# Patient Record
Sex: Male | Born: 1980 | Race: White | Hispanic: No | Marital: Single
Health system: Southern US, Community
[De-identification: ages and names within clinical notes are randomized; demographics above are authoritative.]

## PROBLEM LIST (undated history)

## (undated) DIAGNOSIS — I10 Essential (primary) hypertension: Secondary | ICD-10-CM

---

## 2017-11-02 ENCOUNTER — Emergency Department (HOSPITAL_COMMUNITY): Payer: Non-veteran care

## 2017-11-02 ENCOUNTER — Inpatient Hospital Stay (HOSPITAL_COMMUNITY)
Admission: EM | Admit: 2017-11-02 | Discharge: 2017-11-06 | DRG: 897 | Disposition: A | Discharge status: Home / Self Care | Payer: Non-veteran care | Attending: Internal Medicine | Admitting: Internal Medicine

## 2017-11-02 ENCOUNTER — Encounter (HOSPITAL_COMMUNITY): Payer: Self-pay | Admitting: Internal Medicine

## 2017-11-02 DIAGNOSIS — E875 Hyperkalemia: Secondary | ICD-10-CM | POA: Diagnosis present

## 2017-11-02 DIAGNOSIS — R748 Abnormal levels of other serum enzymes: Secondary | ICD-10-CM | POA: Diagnosis present

## 2017-11-02 DIAGNOSIS — R7989 Other specified abnormal findings of blood chemistry: Secondary | ICD-10-CM

## 2017-11-02 DIAGNOSIS — Y9355 Activity, bike riding: Secondary | ICD-10-CM | POA: Diagnosis not present

## 2017-11-02 DIAGNOSIS — K219 Gastro-esophageal reflux disease without esophagitis: Secondary | ICD-10-CM | POA: Diagnosis present

## 2017-11-02 DIAGNOSIS — D696 Thrombocytopenia, unspecified: Secondary | ICD-10-CM | POA: Diagnosis present

## 2017-11-02 DIAGNOSIS — S01512D Laceration without foreign body of oral cavity, subsequent encounter: Secondary | ICD-10-CM | POA: Diagnosis not present

## 2017-11-02 DIAGNOSIS — I10 Essential (primary) hypertension: Secondary | ICD-10-CM | POA: Diagnosis present

## 2017-11-02 DIAGNOSIS — R569 Unspecified convulsions: Secondary | ICD-10-CM | POA: Diagnosis present

## 2017-11-02 DIAGNOSIS — S01512A Laceration without foreign body of oral cavity, initial encounter: Secondary | ICD-10-CM | POA: Diagnosis present

## 2017-11-02 DIAGNOSIS — F10239 Alcohol dependence with withdrawal, unspecified: Secondary | ICD-10-CM | POA: Diagnosis present

## 2017-11-02 DIAGNOSIS — Z79899 Other long term (current) drug therapy: Secondary | ICD-10-CM | POA: Diagnosis not present

## 2017-11-02 DIAGNOSIS — X58XXXD Exposure to other specified factors, subsequent encounter: Secondary | ICD-10-CM | POA: Diagnosis not present

## 2017-11-02 DIAGNOSIS — F10939 Alcohol use, unspecified with withdrawal, unspecified: Secondary | ICD-10-CM | POA: Diagnosis present

## 2017-11-02 DIAGNOSIS — R945 Abnormal results of liver function studies: Secondary | ICD-10-CM

## 2017-11-02 DIAGNOSIS — K760 Fatty (change of) liver, not elsewhere classified: Secondary | ICD-10-CM | POA: Diagnosis not present

## 2017-11-02 DIAGNOSIS — F1010 Alcohol abuse, uncomplicated: Secondary | ICD-10-CM

## 2017-11-02 DIAGNOSIS — R740 Nonspecific elevation of levels of transaminase and lactic acid dehydrogenase [LDH]: Secondary | ICD-10-CM | POA: Diagnosis present

## 2017-11-02 HISTORY — DX: Essential (primary) hypertension: I10

## 2017-11-02 LAB — COMPREHENSIVE METABOLIC PANEL
ALBUMIN: 3.8 g/dL (ref 3.5–5.0)
ALT: 80 U/L — AB (ref 0–44)
AST: 126 U/L — AB (ref 15–41)
Alkaline Phosphatase: 49 U/L (ref 38–126)
Anion gap: 8 (ref 5–15)
BUN: 8 mg/dL (ref 6–20)
CHLORIDE: 106 mmol/L (ref 98–111)
CO2: 22 mmol/L (ref 22–32)
Calcium: 8.2 mg/dL — ABNORMAL LOW (ref 8.9–10.3)
Creatinine, Ser: 0.94 mg/dL (ref 0.61–1.24)
GFR calc Af Amer: >60 mL/min (ref >60)
GFR calc non Af Amer: >60 mL/min (ref >60)
GLUCOSE: 120 mg/dL — AB (ref 70–99)
POTASSIUM: 5.2 mmol/L — AB (ref 3.5–5.1)
SODIUM: 136 mmol/L (ref 135–145)
Total Bilirubin: 2.5 mg/dL — ABNORMAL HIGH (ref 0.3–1.2)
Total Protein: 5.8 g/dL — ABNORMAL LOW (ref 6.5–8.1)

## 2017-11-02 LAB — APTT: aPTT: 29 seconds (ref 24–36)

## 2017-11-02 LAB — RENAL FUNCTION PANEL
ALBUMIN: 4.3 g/dL (ref 3.5–5.0)
ANION GAP: 13 (ref 5–15)
BUN: 7 mg/dL (ref 6–20)
CHLORIDE: 103 mmol/L (ref 98–111)
CO2: 22 mmol/L (ref 22–32)
Calcium: 9.6 mg/dL (ref 8.9–10.3)
Creatinine, Ser: 0.88 mg/dL (ref 0.61–1.24)
GFR calc Af Amer: >60 mL/min (ref >60)
Glucose, Bld: 92 mg/dL (ref 70–99)
PHOSPHORUS: 2.7 mg/dL (ref 2.5–4.6)
POTASSIUM: 3.9 mmol/L (ref 3.5–5.1)
Sodium: 138 mmol/L (ref 135–145)

## 2017-11-02 LAB — ETHANOL: Alcohol, Ethyl (B): <10 mg/dL (ref <10)

## 2017-11-02 LAB — CBC
HEMATOCRIT: 40.1 % (ref 39.0–52.0)
HEMOGLOBIN: 13.3 g/dL (ref 13.0–17.0)
MCH: 33 pg (ref 26.0–34.0)
MCHC: 33.2 g/dL (ref 30.0–36.0)
MCV: 99.5 fL (ref 80.0–100.0)
Platelets: 65 10*3/uL — ABNORMAL LOW (ref 150–400)
RBC: 4.03 MIL/uL — ABNORMAL LOW (ref 4.22–5.81)
RDW: 12.2 % (ref 11.5–15.5)
WBC: 5.6 10*3/uL (ref 4.0–10.5)
nRBC: 0 % (ref 0.0–0.2)

## 2017-11-02 LAB — PROTIME-INR
INR: 1.04
Prothrombin Time: 13.5 seconds (ref 11.4–15.2)

## 2017-11-02 LAB — RAPID URINE DRUG SCREEN, HOSP PERFORMED
AMPHETAMINES: NOT DETECTED
BARBITURATES: NOT DETECTED
Benzodiazepines: NOT DETECTED
Cocaine: NOT DETECTED
OPIATES: NOT DETECTED
TETRAHYDROCANNABINOL: NOT DETECTED

## 2017-11-02 LAB — MAGNESIUM: Magnesium: 1.8 mg/dL (ref 1.7–2.4)

## 2017-11-02 MED ORDER — LORAZEPAM 2 MG/ML IJ SOLN
0.0000 mg | Freq: Four times a day (QID) | INTRAMUSCULAR | Status: DC
  Administered 2017-11-02: 1 mg via INTRAVENOUS
  Filled 2017-11-02: qty 1

## 2017-11-02 MED ORDER — LIDOCAINE-EPINEPHRINE (PF) 2 %-1:200000 IJ SOLN
20.0000 mL | Freq: Once | INTRAMUSCULAR | Status: AC
  Administered 2017-11-02: 20 mL
  Filled 2017-11-02: qty 20

## 2017-11-02 MED ORDER — LACTATED RINGERS IV SOLN
INTRAVENOUS | Status: AC
  Administered 2017-11-02 – 2017-11-03 (×2): via INTRAVENOUS
  Administered 2017-11-03: 100 mL via INTRAVENOUS

## 2017-11-02 MED ORDER — LORAZEPAM 2 MG/ML IJ SOLN
1.0000 mg | Freq: Once | INTRAMUSCULAR | Status: AC
  Administered 2017-11-02: 1 mg via INTRAVENOUS
  Filled 2017-11-02: qty 1

## 2017-11-02 MED ORDER — LORAZEPAM 2 MG/ML IJ SOLN
2.0000 mg | INTRAMUSCULAR | Status: DC | PRN
  Administered 2017-11-02 – 2017-11-03 (×3): 3 mg via INTRAVENOUS
  Administered 2017-11-03 (×3): 2 mg via INTRAVENOUS
  Administered 2017-11-03: 3 mg via INTRAVENOUS
  Administered 2017-11-03 (×4): 2 mg via INTRAVENOUS
  Administered 2017-11-03 (×3): 3 mg via INTRAVENOUS
  Administered 2017-11-03 – 2017-11-06 (×8): 2 mg via INTRAVENOUS
  Filled 2017-11-02 (×2): qty 1
  Filled 2017-11-02: qty 2
  Filled 2017-11-02 (×8): qty 1
  Filled 2017-11-02 (×3): qty 2
  Filled 2017-11-02 (×4): qty 1
  Filled 2017-11-02 (×2): qty 2
  Filled 2017-11-02 (×3): qty 1

## 2017-11-02 MED ORDER — SODIUM CHLORIDE 0.9 % IV BOLUS
1000.0000 mL | Freq: Once | INTRAVENOUS | Status: AC
  Administered 2017-11-02: 1000 mL via INTRAVENOUS

## 2017-11-02 MED ORDER — VITAMIN B-1 100 MG PO TABS
100.0000 mg | ORAL_TABLET | Freq: Every day | ORAL | Status: DC
  Administered 2017-11-05 – 2017-11-06 (×2): 100 mg via ORAL
  Filled 2017-11-02 (×2): qty 1

## 2017-11-02 MED ORDER — LORAZEPAM 1 MG PO TABS: 0.0000 mg | ORAL_TABLET | Freq: Four times a day (QID) | ORAL | Status: DC

## 2017-11-02 MED ORDER — THIAMINE HCL 100 MG/ML IJ SOLN: 100.0000 mg | Freq: Every day | INTRAMUSCULAR | Status: DC

## 2017-11-02 MED ORDER — CHLORDIAZEPOXIDE HCL 25 MG PO CAPS: 50.0000 mg | ORAL_CAPSULE | Freq: Three times a day (TID) | ORAL | Status: DC

## 2017-11-02 MED ORDER — ADULT MULTIVITAMIN W/MINERALS CH
1.0000 | ORAL_TABLET | Freq: Every day | ORAL | Status: DC
  Administered 2017-11-04 – 2017-11-06 (×3): 1 via ORAL
  Filled 2017-11-02 (×4): qty 1

## 2017-11-02 MED ORDER — ACETAMINOPHEN 650 MG RE SUPP: 650.0000 mg | Freq: Four times a day (QID) | RECTAL | Status: DC | PRN

## 2017-11-02 MED ORDER — FOLIC ACID 5 MG/ML IJ SOLN
1.0000 mg | Freq: Every day | INTRAMUSCULAR | Status: DC
  Administered 2017-11-03 – 2017-11-05 (×3): 1 mg via INTRAVENOUS
  Filled 2017-11-02 (×4): qty 0.2

## 2017-11-02 MED ORDER — LORAZEPAM 2 MG/ML IJ SOLN: 0.0000 mg | Freq: Two times a day (BID) | INTRAMUSCULAR | Status: DC

## 2017-11-02 MED ORDER — ACETAMINOPHEN 325 MG PO TABS
650.0000 mg | ORAL_TABLET | Freq: Four times a day (QID) | ORAL | Status: DC | PRN
  Administered 2017-11-04: 650 mg via ORAL
  Filled 2017-11-02: qty 2

## 2017-11-02 MED ORDER — THIAMINE HCL 100 MG/ML IJ SOLN
100.0000 mg | Freq: Every day | INTRAMUSCULAR | Status: DC
  Administered 2017-11-02 – 2017-11-04 (×3): 100 mg via INTRAVENOUS
  Filled 2017-11-02 (×3): qty 2

## 2017-11-02 MED ORDER — LORAZEPAM 1 MG PO TABS: 0.0000 mg | ORAL_TABLET | Freq: Two times a day (BID) | ORAL | Status: DC

## 2017-11-02 MED ORDER — LABETALOL HCL 5 MG/ML IV SOLN
10.0000 mg | INTRAVENOUS | Status: DC | PRN
  Administered 2017-11-02: 10 mg via INTRAVENOUS
  Filled 2017-11-02: qty 4

## 2017-11-02 MED ORDER — TETANUS-DIPHTH-ACELL PERTUSSIS 5-2.5-18.5 LF-MCG/0.5 IM SUSP
0.5000 mL | Freq: Once | INTRAMUSCULAR | Status: AC
  Administered 2017-11-02: 0.5 mL via INTRAMUSCULAR
  Filled 2017-11-02: qty 0.5

## 2017-11-02 MED ORDER — SENNOSIDES-DOCUSATE SODIUM 8.6-50 MG PO TABS: 1.0000 | ORAL_TABLET | Freq: Every evening | ORAL | Status: DC | PRN
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
## 2017-11-02 NOTE — ED Notes (Signed)
Z61 $1's counted with Autumn RN, given back to pt, in a purple crown royal bag.  2 handles of liquor taken by security and dumped out.

## 2017-11-02 NOTE — ED Notes (Signed)
Patient transported to CT 
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
## 2017-11-02 NOTE — ED Notes (Signed)
Per Dr. Denton Lank order, patient given water to rinse out mouth and suction.

## 2017-11-02 NOTE — ED Notes (Signed)
ED Provider at bedside. 

## 2017-11-02 NOTE — ED Notes (Signed)
Rounding team at the bedside.

## 2017-11-02 NOTE — ED Notes (Signed)
Patient returned from CT
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
## 2017-11-02 NOTE — ED Notes (Signed)
Pt continually attempting to get out of the bed and taking off monitoring equipment. RN and tech attempting to redirect patient back to bed. Pt is very high fall risk and very unsteady on his feet.

## 2017-11-03 ENCOUNTER — Inpatient Hospital Stay (HOSPITAL_COMMUNITY): Payer: Non-veteran care

## 2017-11-03 LAB — CBC
HEMATOCRIT: 39 % (ref 39.0–52.0)
HEMOGLOBIN: 13.3 g/dL (ref 13.0–17.0)
MCH: 34 pg (ref 26.0–34.0)
MCHC: 34.1 g/dL (ref 30.0–36.0)
MCV: 99.7 fL (ref 80.0–100.0)
PLATELETS: 61 10*3/uL — AB (ref 150–400)
RBC: 3.91 MIL/uL — ABNORMAL LOW (ref 4.22–5.81)
RDW: 12.4 % (ref 11.5–15.5)
WBC: 5.3 10*3/uL (ref 4.0–10.5)
nRBC: 0 % (ref 0.0–0.2)

## 2017-11-03 LAB — COMPREHENSIVE METABOLIC PANEL
ALBUMIN: 4.1 g/dL (ref 3.5–5.0)
ALK PHOS: 51 U/L (ref 38–126)
ALT: 68 U/L — ABNORMAL HIGH (ref 0–44)
AST: 77 U/L — ABNORMAL HIGH (ref 15–41)
Anion gap: 11 (ref 5–15)
BUN: 6 mg/dL (ref 6–20)
CALCIUM: 9.2 mg/dL (ref 8.9–10.3)
CHLORIDE: 101 mmol/L (ref 98–111)
CO2: 23 mmol/L (ref 22–32)
Creatinine, Ser: 0.85 mg/dL (ref 0.61–1.24)
GFR calc Af Amer: >60 mL/min (ref >60)
GFR calc non Af Amer: >60 mL/min (ref >60)
GLUCOSE: 89 mg/dL (ref 70–99)
Potassium: 3.6 mmol/L (ref 3.5–5.1)
SODIUM: 135 mmol/L (ref 135–145)
Total Bilirubin: 2.1 mg/dL — ABNORMAL HIGH (ref 0.3–1.2)
Total Protein: 6.5 g/dL (ref 6.5–8.1)

## 2017-11-03 LAB — HIV ANTIBODY (ROUTINE TESTING W REFLEX): HIV SCREEN 4TH GENERATION: NONREACTIVE

## 2017-11-03 LAB — MRSA PCR SCREENING: MRSA by PCR: NEGATIVE

## 2017-11-03 MED ORDER — PHENYLEPHRINE HCL-NACL 10-0.9 MG/250ML-% IV SOLN: 0.0000 ug/min | INTRAVENOUS | Status: DC

## 2017-11-03 MED ORDER — LACTATED RINGERS IV SOLN
INTRAVENOUS | Status: DC
  Administered 2017-11-03 – 2017-11-04 (×2): via INTRAVENOUS

## 2017-11-03 NOTE — Progress Notes (Signed)
Pt currently withdrawing from alcohol. CIWA 20 @ 2300 and 17 @ midnight. Pt's BP in 160's/100's. Paged on call resident, and received orders to continue treating with ativan and no new orders for treating high blood pressure.

## 2017-11-03 NOTE — Progress Notes (Addendum)
Paged by RN about increasing CIWA score with agitation, tremors, and hypertension. Examined and evaluated patient at bedside with senior resident. He was observed in bed with mittens and restraints in place. He is intermittently somnolent and falling asleep while examination. Able to follow directions. However unable to stay awake long enough to answer orientation questions. Nurse tech mentions that she was pinched once during his bouts of agitation. RN informed us that he will stay somnolent for about 30 minutes or so and become agitated for 30 minutes before the next Ativan dose. Per chart review, patient's last dose of Ativan was 30 minutes ago. Blood pressure is in 163/110, which is consistent with his bp since admission. Telemetry was reviewed which showed sinus tachycardia with HR fluctuating from 100s to 130s.  Based on the fact that he is somnolent and not currently agitated on examination, current dose of Ativan appears to be appropriate for the patient. We will continue to observe for now.

## 2017-11-03 NOTE — Care Management Note (Addendum)
Case Management Note  Patient Details  Name: Gregory Newman MRN: [id number] Date of Birth: 19-Apr-1980  Subjective/Objective:  Pt presented for ETOH withdrawal/ Seizures. PTA from home with support of mother. Mother in the room at the time of conversation.                   Action/Plan: Patient utilizes the [facility]- CM did call Fees Coordinator April Alexander to question in regards to PCP and CSW. Patient unable to make CM aware of PCP. Awaiting call back. Per mother patient receives medications via mail from [facility] [place]. Mother not sure how compliant the patient is with medications at this time.    Expected Discharge Date:                  Expected Discharge Plan:  Home/Self Care  In-House Referral:  Clinical Social Work  Discharge planning Services  CM Consult  Post Acute Care Choice:   N/A Choice offered to:   N/A  DME Arranged:   N/A DME Agency:   N/A  HH Arranged:   N/A HH Agency:   N/A  Status of Service:Completed   If discussed at Long Length of Stay Meetings, dates discussed:    Additional Comments: 1443 11-06-17 Tomi Bamberger, RN,BSN [phone] Patient was provided resources to [facility] from CSW- CM will fax d/c summary to [place]. No further needs from CM at this time.    1047 11-03-17 Tomi Bamberger, RN,BSN [phone] CM received phone call from April Alexander in regards to patients PCP. PCP is Dr. Melvyn Novas. Salvadore Dom is the Clinical Social Worker @ [phone] ext [phone] Pager # [phone]. Please fax d/c summary and any new medications to [phone] once stable. CM will continue to monitor for additional needs.  Gala Lewandowsky, RN 11/03/2017, 10:25 AM

## 2017-11-03 NOTE — Progress Notes (Signed)
Dr. Karilyn Cota notified of elevated diastolic pressures.  No new orders at this time.
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
## 2017-11-04 DIAGNOSIS — I10 Essential (primary) hypertension: Secondary | ICD-10-CM

## 2017-11-04 DIAGNOSIS — S01512D Laceration without foreign body of oral cavity, subsequent encounter: Secondary | ICD-10-CM

## 2017-11-04 DIAGNOSIS — F10239 Alcohol dependence with withdrawal, unspecified: Principal | ICD-10-CM

## 2017-11-04 DIAGNOSIS — X58XXXD Exposure to other specified factors, subsequent encounter: Secondary | ICD-10-CM

## 2017-11-04 DIAGNOSIS — Z79899 Other long term (current) drug therapy: Secondary | ICD-10-CM

## 2017-11-04 DIAGNOSIS — R569 Unspecified convulsions: Secondary | ICD-10-CM

## 2017-11-04 DIAGNOSIS — R740 Nonspecific elevation of levels of transaminase and lactic acid dehydrogenase [LDH]: Secondary | ICD-10-CM

## 2017-11-04 LAB — HEPATITIS A ANTIBODY, IGM: Hep A IgM: NEGATIVE

## 2017-11-04 LAB — HEPATITIS A ANTIBODY, TOTAL: HEP A TOTAL AB: POSITIVE — AB

## 2017-11-04 LAB — HEPATITIS B SURFACE ANTIBODY, QUANTITATIVE: Hepatitis B-Post: >1000 m[IU]/mL (ref >9.9)

## 2017-11-04 LAB — HCV COMMENT:

## 2017-11-04 LAB — HEPATITIS B CORE ANTIBODY, IGM: HEP B C IGM: NEGATIVE

## 2017-11-04 LAB — HEPATITIS C ANTIBODY (REFLEX)

## 2017-11-04 LAB — HEPATITIS B SURFACE ANTIGEN: HEP B S AG: NEGATIVE

## 2017-11-04 MED ORDER — PNEUMOCOCCAL VAC POLYVALENT 25 MCG/0.5ML IJ INJ
0.5000 mL | INJECTION | INTRAMUSCULAR | Status: DC
  Filled 2017-11-04: qty 0.5

## 2017-11-04 MED ORDER — INFLUENZA VAC SPLIT QUAD 0.5 ML IM SUSY
0.5000 mL | PREFILLED_SYRINGE | INTRAMUSCULAR | Status: DC
  Filled 2017-11-04: qty 0.5

## 2017-11-04 MED ORDER — NICOTINE 14 MG/24HR TD PT24
14.0000 mg | MEDICATED_PATCH | Freq: Every day | TRANSDERMAL | Status: DC
  Administered 2017-11-04 – 2017-11-06 (×3): 14 mg via TRANSDERMAL
  Filled 2017-11-04 (×3): qty 1

## 2017-11-04 NOTE — Plan of Care (Signed)
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
## 2017-11-05 DIAGNOSIS — K760 Fatty (change of) liver, not elsewhere classified: Secondary | ICD-10-CM

## 2017-11-05 MED ORDER — ALUM & MAG HYDROXIDE-SIMETH 200-200-20 MG/5ML PO SUSP
15.0000 mL | Freq: Four times a day (QID) | ORAL | Status: DC | PRN
  Administered 2017-11-05: 15 mL via ORAL
  Filled 2017-11-05: qty 30

## 2017-11-05 MED ORDER — PANTOPRAZOLE SODIUM 40 MG PO TBEC
40.0000 mg | DELAYED_RELEASE_TABLET | Freq: Every day | ORAL | Status: DC
  Administered 2017-11-05 – 2017-11-06 (×2): 40 mg via ORAL
  Filled 2017-11-05 (×2): qty 1

## 2017-11-05 MED ORDER — FOLIC ACID 1 MG PO TABS
1.0000 mg | ORAL_TABLET | Freq: Every day | ORAL | Status: DC
  Administered 2017-11-06: 1 mg via ORAL
  Filled 2017-11-05: qty 1
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
## 2017-11-05 NOTE — Plan of Care (Signed)

## 2017-11-05 NOTE — Progress Notes (Signed)
Telesitter discontinued per MD Rehman. Patient stable.

## 2017-11-06 DIAGNOSIS — R748 Abnormal levels of other serum enzymes: Secondary | ICD-10-CM

## 2017-11-06 DIAGNOSIS — D696 Thrombocytopenia, unspecified: Secondary | ICD-10-CM

## 2017-11-06 MED ORDER — NALTREXONE HCL 50 MG PO TABS: 50.0000 mg | ORAL_TABLET | Freq: Every day | ORAL | 0 refills | Status: AC

## 2017-11-06 MED ORDER — MAGIC MOUTHWASH W/LIDOCAINE: 5.0000 mL | Freq: Three times a day (TID) | ORAL | 0 refills | Status: AC | PRN
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
## 2017-11-06 NOTE — Progress Notes (Signed)
Discharge instructions reviewed with pt. Pt has no questions at this time. Prescription given to pt. Pt's mothers stated she picked up his belongings yesterday. IV d/c. Pt says he has a PCPat the [place].

## 2017-11-06 NOTE — Progress Notes (Signed)
Prior to being discharged pt wanted MD to look at his tongue. Md paged and made aware. New prescription given to pt.
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
## 2019-11-26 ENCOUNTER — Other Ambulatory Visit: Payer: Self-pay

## 2019-11-26 ENCOUNTER — Emergency Department (HOSPITAL_COMMUNITY)
Admission: EM | Admit: 2019-11-26 | Discharge: 2019-11-26 | Disposition: A | Discharge status: Home / Self Care | Payer: No Typology Code available for payment source | Attending: Emergency Medicine | Admitting: Emergency Medicine

## 2019-11-26 ENCOUNTER — Emergency Department (HOSPITAL_COMMUNITY): Payer: No Typology Code available for payment source

## 2019-11-26 DIAGNOSIS — I10 Essential (primary) hypertension: Secondary | ICD-10-CM | POA: Insufficient documentation

## 2019-11-26 DIAGNOSIS — R000 Tachycardia, unspecified: Secondary | ICD-10-CM | POA: Insufficient documentation

## 2019-11-26 DIAGNOSIS — F1020 Alcohol dependence, uncomplicated: Secondary | ICD-10-CM | POA: Insufficient documentation

## 2019-11-26 DIAGNOSIS — R569 Unspecified convulsions: Secondary | ICD-10-CM | POA: Insufficient documentation

## 2019-11-26 LAB — COMPREHENSIVE METABOLIC PANEL
ALT: 45 U/L — ABNORMAL HIGH (ref 0–44)
AST: 95 U/L — ABNORMAL HIGH (ref 15–41)
Albumin: 4 g/dL (ref 3.5–5.0)
Alkaline Phosphatase: 70 U/L (ref 38–126)
Anion gap: 19 — ABNORMAL HIGH (ref 5–15)
BUN: <5 mg/dL — ABNORMAL LOW (ref 6–20)
CO2: 21 mmol/L — ABNORMAL LOW (ref 22–32)
Calcium: 9.9 mg/dL (ref 8.9–10.3)
Chloride: 99 mmol/L (ref 98–111)
Creatinine, Ser: 0.87 mg/dL (ref 0.61–1.24)
GFR, Estimated: >60 mL/min (ref >60)
Glucose, Bld: 136 mg/dL — ABNORMAL HIGH (ref 70–99)
Potassium: 3.7 mmol/L (ref 3.5–5.1)
Sodium: 139 mmol/L (ref 135–145)
Total Bilirubin: 1.2 mg/dL (ref 0.3–1.2)
Total Protein: 6.8 g/dL (ref 6.5–8.1)

## 2019-11-26 LAB — CBC WITH DIFFERENTIAL/PLATELET
Abs Immature Granulocytes: 0.02 10*3/uL (ref 0.00–0.07)
Basophils Absolute: 0.1 10*3/uL (ref 0.0–0.1)
Basophils Relative: 2 %
Eosinophils Absolute: 0 10*3/uL (ref 0.0–0.5)
Eosinophils Relative: 1 %
HCT: 39.3 % (ref 39.0–52.0)
Hemoglobin: 13.1 g/dL (ref 13.0–17.0)
Immature Granulocytes: 1 %
Lymphocytes Relative: 10 %
Lymphs Abs: 0.3 10*3/uL — ABNORMAL LOW (ref 0.7–4.0)
MCH: 33.3 pg (ref 26.0–34.0)
MCHC: 33.3 g/dL (ref 30.0–36.0)
MCV: 100 fL (ref 80.0–100.0)
Monocytes Absolute: 0.5 10*3/uL (ref 0.1–1.0)
Monocytes Relative: 17 %
Neutro Abs: 2 10*3/uL (ref 1.7–7.7)
Neutrophils Relative %: 69 %
Platelets: 67 10*3/uL — ABNORMAL LOW (ref 150–400)
RBC: 3.93 MIL/uL — ABNORMAL LOW (ref 4.22–5.81)
RDW: 14.5 % (ref 11.5–15.5)
WBC: 2.8 10*3/uL — ABNORMAL LOW (ref 4.0–10.5)
nRBC: 0 % (ref 0.0–0.2)

## 2019-11-26 LAB — CBG MONITORING, ED: Glucose-Capillary: 120 mg/dL — ABNORMAL HIGH (ref 70–99)

## 2019-11-26 LAB — LIPASE, BLOOD: Lipase: 82 U/L — ABNORMAL HIGH (ref 11–51)

## 2019-11-26 LAB — SALICYLATE LEVEL: Salicylate Lvl: <7 mg/dL — ABNORMAL LOW (ref 7.0–30.0)

## 2019-11-26 LAB — ACETAMINOPHEN LEVEL: Acetaminophen (Tylenol), Serum: <10 ug/mL — ABNORMAL LOW (ref 10–30)

## 2019-11-26 LAB — ETHANOL: Alcohol, Ethyl (B): <10 mg/dL (ref <10)

## 2019-11-26 MED ORDER — THIAMINE HCL 100 MG/ML IJ SOLN
100.0000 mg | Freq: Every day | INTRAMUSCULAR | Status: DC
  Administered 2019-11-26: 100 mg via INTRAVENOUS
  Filled 2019-11-26: qty 2

## 2019-11-26 MED ORDER — LORAZEPAM 1 MG PO TABS: 0.0000 mg | ORAL_TABLET | Freq: Two times a day (BID) | ORAL | Status: DC

## 2019-11-26 MED ORDER — LORAZEPAM 2 MG/ML IJ SOLN: 0.0000 mg | Freq: Four times a day (QID) | INTRAMUSCULAR | Status: DC

## 2019-11-26 MED ORDER — LORAZEPAM 1 MG PO TABS: 0.0000 mg | ORAL_TABLET | Freq: Four times a day (QID) | ORAL | Status: DC

## 2019-11-26 MED ORDER — CHLORDIAZEPOXIDE HCL 25 MG PO CAPS: ORAL_CAPSULE | ORAL | 0 refills | Status: DC

## 2019-11-26 MED ORDER — LORAZEPAM 2 MG/ML IJ SOLN: 0.0000 mg | Freq: Two times a day (BID) | INTRAMUSCULAR | Status: DC

## 2019-11-26 MED ORDER — THIAMINE HCL 100 MG PO TABS: 100.0000 mg | ORAL_TABLET | Freq: Every day | ORAL | Status: DC

## 2019-11-26 MED ORDER — LORAZEPAM 2 MG/ML IJ SOLN
INTRAMUSCULAR | Status: AC
  Administered 2019-11-26: 2 mg
  Filled 2019-11-26: qty 1

## 2019-11-26 MED ORDER — CHLORDIAZEPOXIDE HCL 25 MG PO CAPS: ORAL_CAPSULE | ORAL | 0 refills | Status: AC

## 2019-11-26 MED ORDER — LEVETIRACETAM IN NACL 1000 MG/100ML IV SOLN
1000.0000 mg | Freq: Once | INTRAVENOUS | Status: AC
  Administered 2019-11-26: 1000 mg via INTRAVENOUS
  Filled 2019-11-26: qty 100

## 2019-11-26 MED ORDER — THIAMINE HCL 100 MG/ML IJ SOLN
Freq: Once | INTRAVENOUS | Status: AC
  Filled 2019-11-26: qty 1000

## 2019-11-26 NOTE — ED Notes (Signed)
Patient transported to CT 

## 2019-11-26 NOTE — ED Notes (Deleted)
Pt verbalized understanding of instructions, physi aware he can return. Did not want discharge paperwork

## 2019-11-26 NOTE — Discharge Instructions (Addendum)
You were seen in the ED for seizures  Your seizures are from alcohol use and alcohol withdrawal  We recommended admission to the hospital for more monitoring and medical detoxing but you declined  You requested Librium.  Please do not take this medicine if you start drinking again.  Please see outpatient resources that you can reach out to for any alcohol use disorder treatment.  Return for headaches, vision changes, bright red blood or black vomit or stool, chest pain, shortness of breath, suicidal or homicidal thoughts, auditory or visual or tactile hallucinations, seizures  [employer] [address] [place], [place] [address] [phone]  [facility]  [address] [place] [address] [phone]   [facility]   [address] [place], [place] [address]  [phone]  [facility]  [address] [place], [place] [address]  [phone]

## 2019-11-26 NOTE — ED Notes (Signed)
Walked patient up the hall patient did ok patient back in the bed call bell in reach
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
## 2019-11-26 NOTE — ED Notes (Signed)
Got patient on the monitor did ekg shown to Dr Anitra Lauth patient is in a gown with call bell in reach
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
## 2019-12-02 LAB — VITAMIN B1: Vitamin B1 (Thiamine): 109 nmol/L (ref 66.5–200.0)

## 2020-08-20 DEATH — deceased

## 2021-04-09 IMAGING — CT CT HEAD W/O CM
4 series · 16 of 47 positions shown, 18 images · non-contrast
Comparison: CT head 11/02/2017

CLINICAL DATA: Seizures.  Alcohol use.

EXAM:
CT HEAD WITHOUT CONTRAST
TECHNIQUE: Contiguous axial images were obtained from the base of the skull
through the vertex without intravenous contrast.

[Series 3: head wo · axial · 0.53mm/px · z∈[-126,-6]mm · 7 of 34 slices shown, 9 images]
[im 5/34  brain]
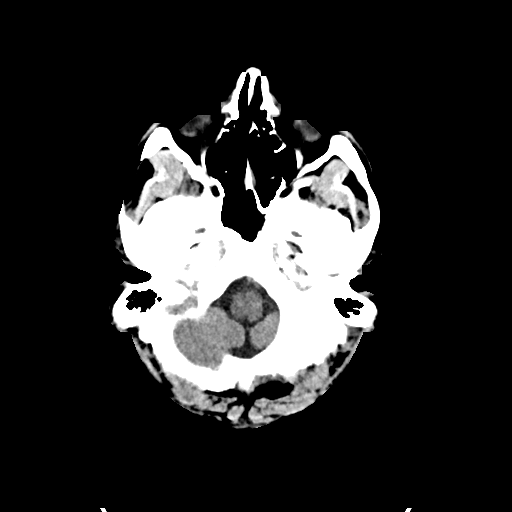
[im 5/34  bone]
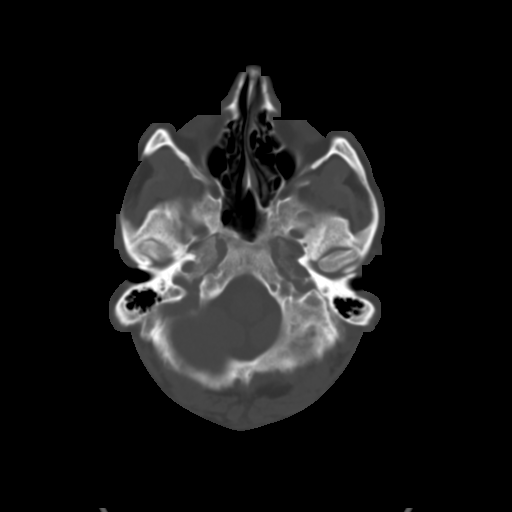
[im 9/34  brain]
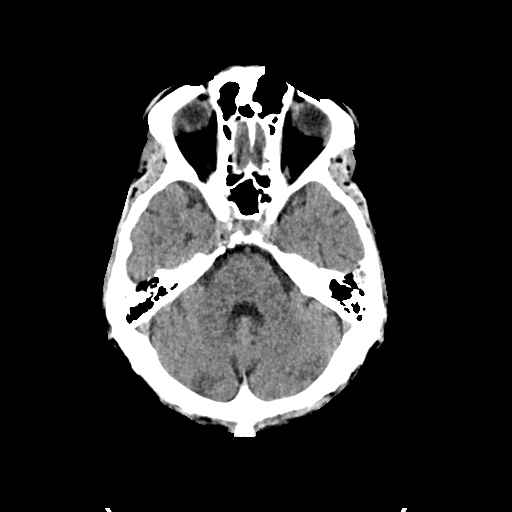
[im 13/34  brain]
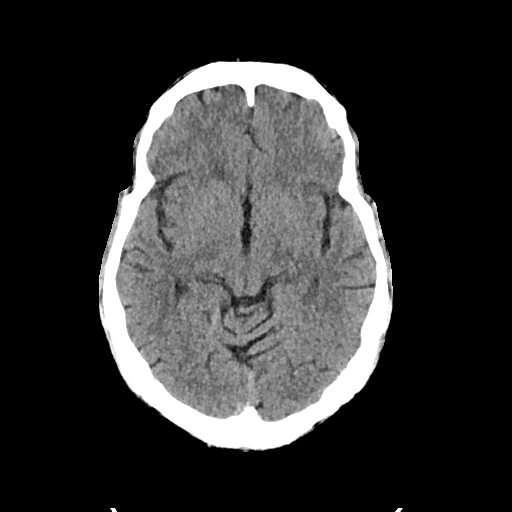
[im 17/34  brain]
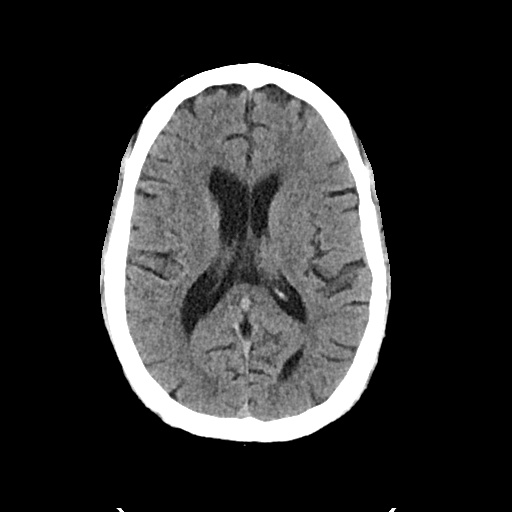
[im 21/34  brain]
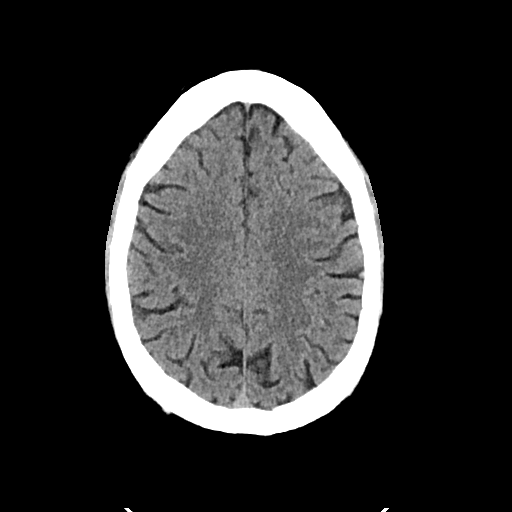
[im 21/34  bone]
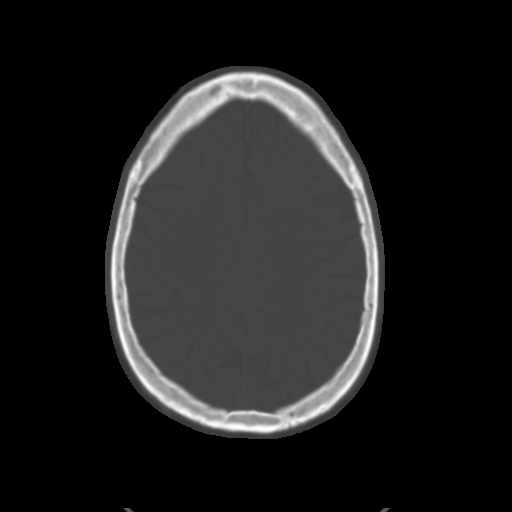
[im 25/34  brain]
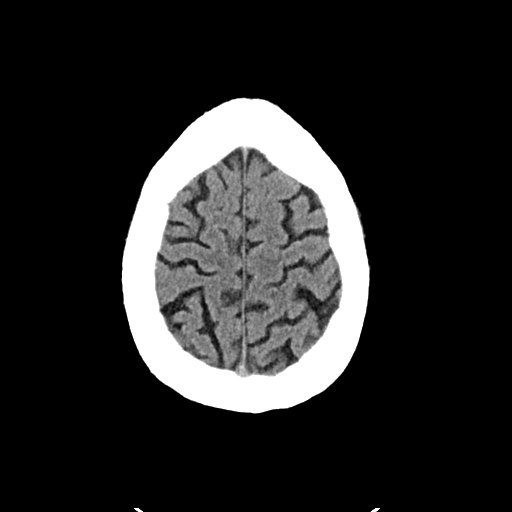
[im 29/34  brain]
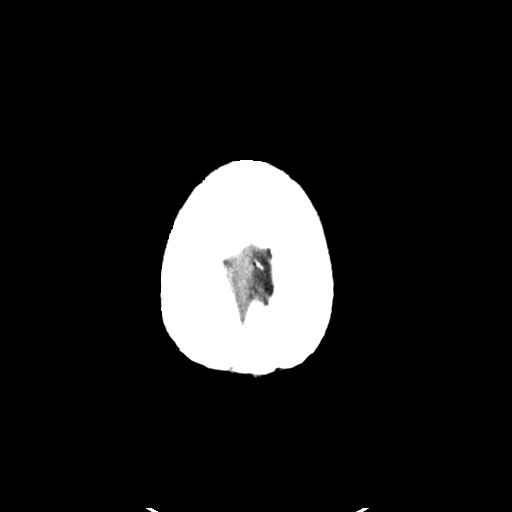

[Series 4: head bone · axial · 0.53mm/px · z∈[-130,-98]mm · 3 of 84 slices shown]
[im 9/84  bone]
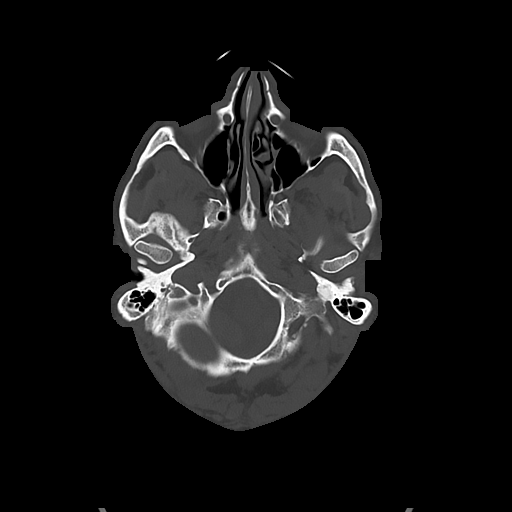
[im 17/84  bone]
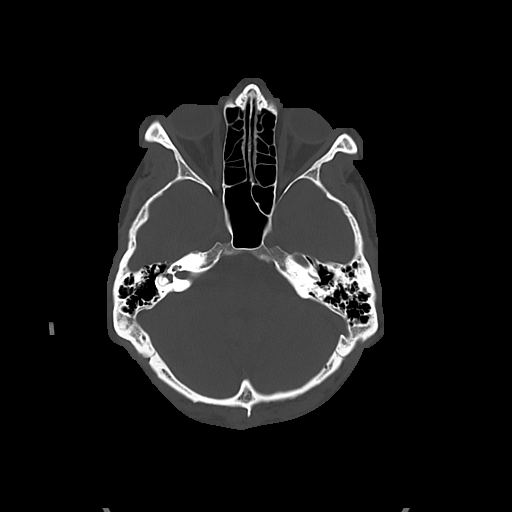
[im 25/84  bone]
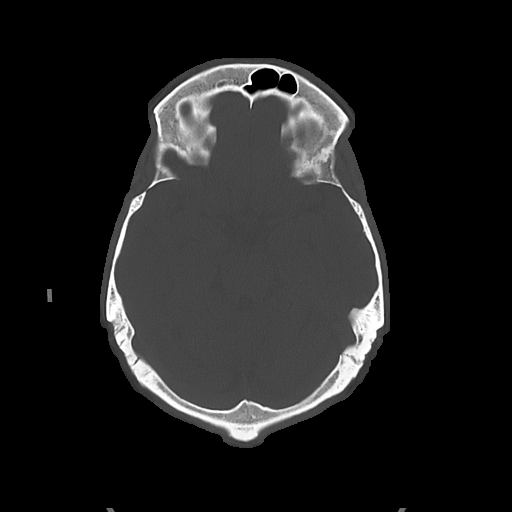

[Series 5: cor soft · coronal · 0.37mm/px · 3 of 86 slices shown]
[im 29/86  brain]
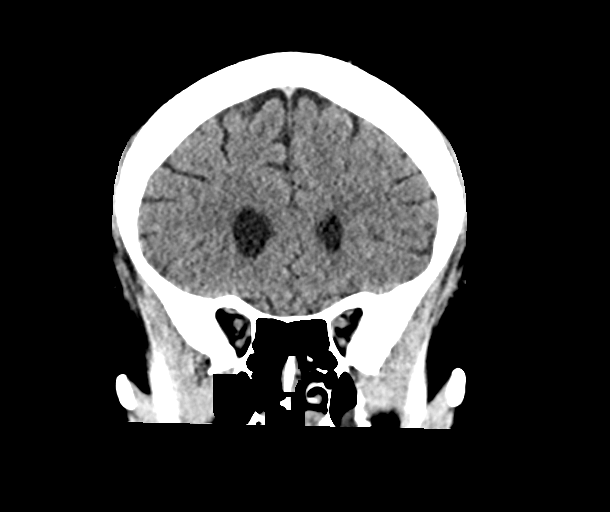
[im 38/86  brain]
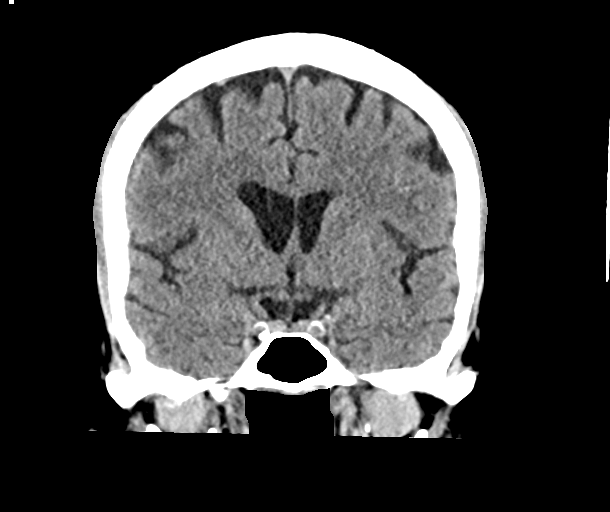
[im 48/86  brain]
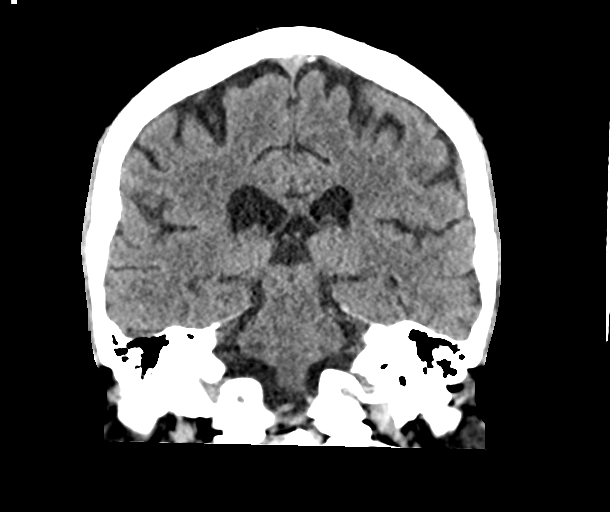

[Series 6: sag soft · sagittal · 0.36mm/px · 3 of 65 slices shown]
[im 22/65  brain]
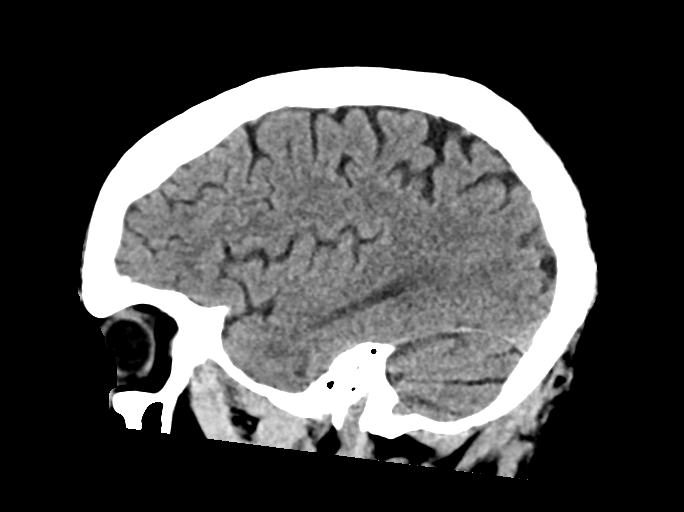
[im 33/65  brain]
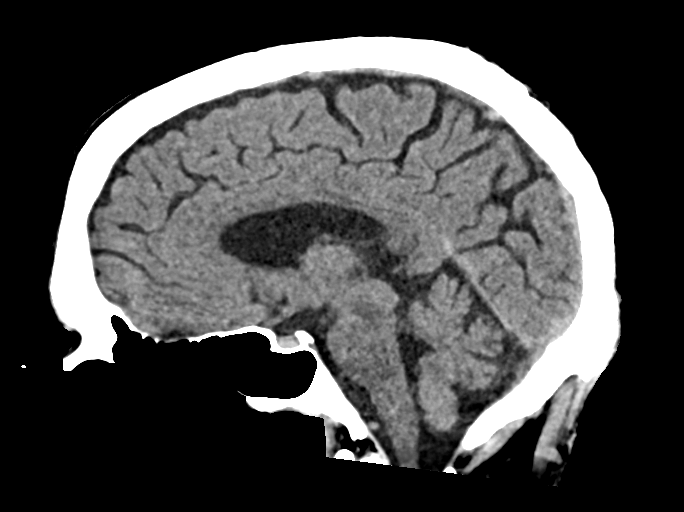
[im 43/65  brain]
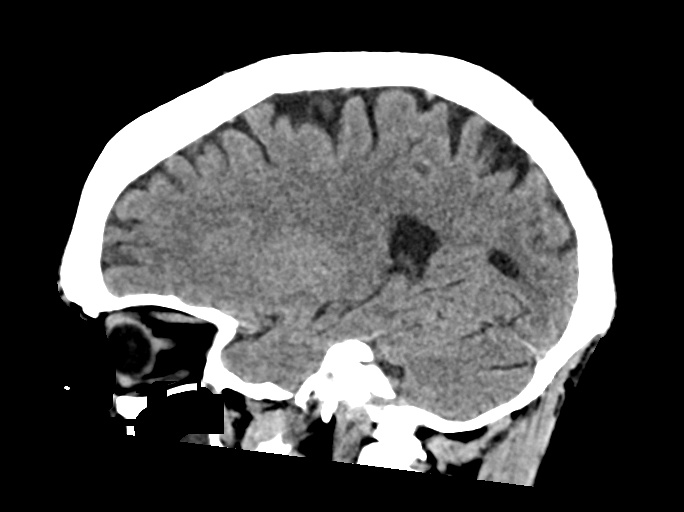

[16 of 47 positions shown; findings below may reference images not displayed]

FINDINGS: Brain: No evidence of acute infarction, hemorrhage, hydrocephalus,
extra-axial collection or mass lesion/mass effect.

Vascular: No hyperdense vessel or unexpected calcification.

Skull: Normal. Negative for fracture or focal lesion.

Sinuses/Orbits: Partial opacification of the left maxillary sinus
and mild mucosal thickening of the right maxillary sinus. Orbits are
unremarkable.

Other: None.
IMPRESSION: 1. No acute intracranial abnormality.
2. Bilateral maxillary sinus disease.
